# Patient Record
Sex: Male | Born: 1970 | Race: Black or African American | Hispanic: No | Marital: Single | Smoking: Never smoker
Health system: Southern US, Community
[De-identification: ages and names within clinical notes are randomized; demographics above are authoritative.]

## PROBLEM LIST (undated history)

## (undated) HISTORY — PX: FRACTURE SURGERY: SHX138

---

## 2016-07-26 ENCOUNTER — Emergency Department (HOSPITAL_COMMUNITY)
Admission: EM | Admit: 2016-07-26 | Discharge: 2016-07-26 | Disposition: A | Discharge status: Home / Self Care | Payer: Self-pay | Attending: Emergency Medicine | Admitting: Emergency Medicine

## 2016-07-26 ENCOUNTER — Encounter (HOSPITAL_COMMUNITY): Payer: Self-pay | Admitting: Emergency Medicine

## 2016-07-26 ENCOUNTER — Emergency Department (HOSPITAL_COMMUNITY): Payer: Self-pay

## 2016-07-26 DIAGNOSIS — R0789 Other chest pain: Secondary | ICD-10-CM | POA: Insufficient documentation

## 2016-07-26 DIAGNOSIS — J209 Acute bronchitis, unspecified: Secondary | ICD-10-CM | POA: Insufficient documentation

## 2016-07-26 DIAGNOSIS — H9222 Otorrhagia, left ear: Secondary | ICD-10-CM | POA: Insufficient documentation

## 2016-07-26 LAB — I-STAT TROPONIN, ED: Troponin i, poc: 0 ng/mL (ref 0.00–0.08)

## 2016-07-26 LAB — CBC
HEMATOCRIT: 46.4 % (ref 39.0–52.0)
Hemoglobin: 16 g/dL (ref 13.0–17.0)
MCH: 28.7 pg (ref 26.0–34.0)
MCHC: 34.5 g/dL (ref 30.0–36.0)
MCV: 83.3 fL (ref 78.0–100.0)
Platelets: 260 10*3/uL (ref 150–400)
RBC: 5.57 MIL/uL (ref 4.22–5.81)
RDW: 14 % (ref 11.5–15.5)
WBC: 7.3 10*3/uL (ref 4.0–10.5)

## 2016-07-26 LAB — BASIC METABOLIC PANEL
Anion gap: 8 (ref 5–15)
BUN: 10 mg/dL (ref 6–20)
CO2: 27 mmol/L (ref 22–32)
Calcium: 9.1 mg/dL (ref 8.9–10.3)
Chloride: 101 mmol/L (ref 101–111)
Creatinine, Ser: 1.24 mg/dL (ref 0.61–1.24)
GFR calc Af Amer: >60 mL/min (ref >60)
Glucose, Bld: 106 mg/dL — ABNORMAL HIGH (ref 65–99)
POTASSIUM: 3.8 mmol/L (ref 3.5–5.1)
Sodium: 136 mmol/L (ref 135–145)

## 2016-07-26 NOTE — ED Provider Notes (Signed)
[facility]-EMERGENCY DEPT Provider Note   CSN: [id number] Arrival date & time: 07/26/16  0930     History   Chief Complaint Chief Complaint  Patient presents with  . Chest Pain  . Abdominal Cramping  . Back Pain    HPI Paul Delgado is a 46 y.o. male.  HPI Patient reports that he had some bleeding from his left ear. He reports he had been scratching it pretty aggressively because a couple days ago was popping a lot. He has not seen any blood today. The popping sounds have decreased. He also reports that he's gotten spontaneous cramps on both sides of his chest wall. These frequently are triggered by laughing but also can occur while he is driving and at rest. He denies any problems with chest pain or dyspnea with exertion. He reports he goes to the gym sometimes works out and he is not having any limitations to his physical capacity. He reports he has had some cough recently with some yellow and brown mucus production. No fever and no shortness of breath. No abdominal pain nausea or vomiting. No lower extremity swelling or calf pain. History reviewed. No pertinent past medical history.  There are no active problems to display for this patient.   Past Surgical History:  Procedure Laterality Date  . FRACTURE SURGERY         Home Medications    Prior to Admission medications   Not on File    Family History No family history on file.  Social History Social History  Substance Use Topics  . Smoking status: Never Smoker  . Smokeless tobacco: Never Used  . Alcohol use Yes     Comment: occasional      Allergies   Patient has no known allergies.   Review of Systems Review of Systems  10 Systems reviewed and are negative for acute change except as noted in the HPI.  Physical Exam Updated Vital Signs BP (!) 146/95 (BP Location: Left Arm)   Pulse 84   Temp 98.1 F (36.7 C) (Oral)   Resp 14   Ht  (1.854 m)   Wt (!) 320 lb (145.2 kg)   SpO2 100%   BMI 42.22  kg/m   Physical Exam  Constitutional: He is oriented to person, place, and time. He appears well-developed and well-nourished.  Patient is alert and nontoxic. No respiratory distress. Positive obesity but does also appear to have strong musculature development.  HENT:  Head: Normocephalic and atraumatic.  Nose: Nose normal.  Mouth/Throat: Oropharynx is clear and moist.  Eyes: Conjunctivae and EOM are normal.  Neck: Neck supple. No thyromegaly present.  Cardiovascular: Normal rate, regular rhythm, normal heart sounds and intact distal pulses.   No murmur heard. Pulmonary/Chest: Effort normal and breath sounds normal. No respiratory distress. He exhibits no tenderness.  Chest walls normal.  Abdominal: Soft. He exhibits no distension. There is no tenderness. There is no guarding.  Musculoskeletal: Normal range of motion. He exhibits no edema or tenderness.  Lymphadenopathy:    He has no cervical adenopathy.  Neurological: He is alert and oriented to person, place, and time. No cranial nerve deficit. He exhibits normal muscle tone. Coordination normal.  Skin: Skin is warm and dry.  Psychiatric: He has a normal mood and affect.  Nursing note and vitals reviewed.    ED Treatments / Results  Labs (all labs ordered are listed, but only abnormal results are displayed) Labs Reviewed  BASIC METABOLIC PANEL - Abnormal; Notable for  the following:       Result Value   Glucose, Bld 106 (*)    All other components within normal limits  CBC  I-STAT TROPOININ, ED    EKG  EKG Interpretation  Date/Time:  Tuesday July 26 2016 09:37:46 EDT Ventricular Rate:  85 PR Interval:    QRS Duration: 91 QT Interval:  352 QTC Calculation: 419 R Axis:   46 Text Interpretation:  Sinus rhythm No old tracing to compare Confirmed by [place]  MD, ELLIOTT [phone]) on 07/26/2016 10:35:19 AM       Radiology Dg Chest 2 View  Result Date: 07/26/2016 CLINICAL DATA:  Bilateral chest and abdominal pain for 2  days. EXAM: CHEST  2 VIEW COMPARISON:  None. FINDINGS: Heart and mediastinal contours are within normal limits. No focal opacities or effusions. No acute bony abnormality. IMPRESSION: No active cardiopulmonary disease. Electronically Signed   By: Charlett Nose M.D.   On: 07/26/2016 09:47    Procedures Procedures (including critical care time)  Medications Ordered in ED Medications - No data to display   Initial Impression / Assessment and Plan / ED Course  I have reviewed the triage vital signs and the nursing notes.  Pertinent labs & imaging results that were available during my care of the patient were reviewed by me and considered in my medical decision making (see chart for details).       Final Clinical Impressions(s) / ED Diagnoses   Final diagnoses:  Chest wall pain  Acute bronchitis, unspecified organism  Physical examination is normal. Diagnostic workup was negative. Patient is getting sporadic cramping feelings on his chest wall bilaterally particularly triggered by laughing and driving. This may be musculoskeletal spasm and pain from physical workouts. Patient is counseled for signs and symptoms to trigger recheck but he will take approximately 2 weeks to see if symptoms resolve spontaneously. Patient also describes some bronchitis with productive cough. Chest x-ray is clear, white blood cell count is normal and physical examination is normal. At this time I do not feel he needs additional treatment for this. Patient is given resource guide to establish follow-up.  New Prescriptions New Prescriptions   No medications on file     Arby Barrette, MD 07/26/16 1141

## 2016-07-26 NOTE — ED Triage Notes (Signed)
Patient c/o bilat lateral abd cramps x 2 days. Today patient started having upper central and left sided chest pain that radiates to lower back.

## 2017-07-25 IMAGING — CR DG CHEST 2V
2 series · 2 of 2 positions shown · non-contrast
Comparison: None.

CLINICAL DATA: Bilateral chest and abdominal pain for 2 days.

EXAM:
CHEST  2 VIEW

[w chest pa]
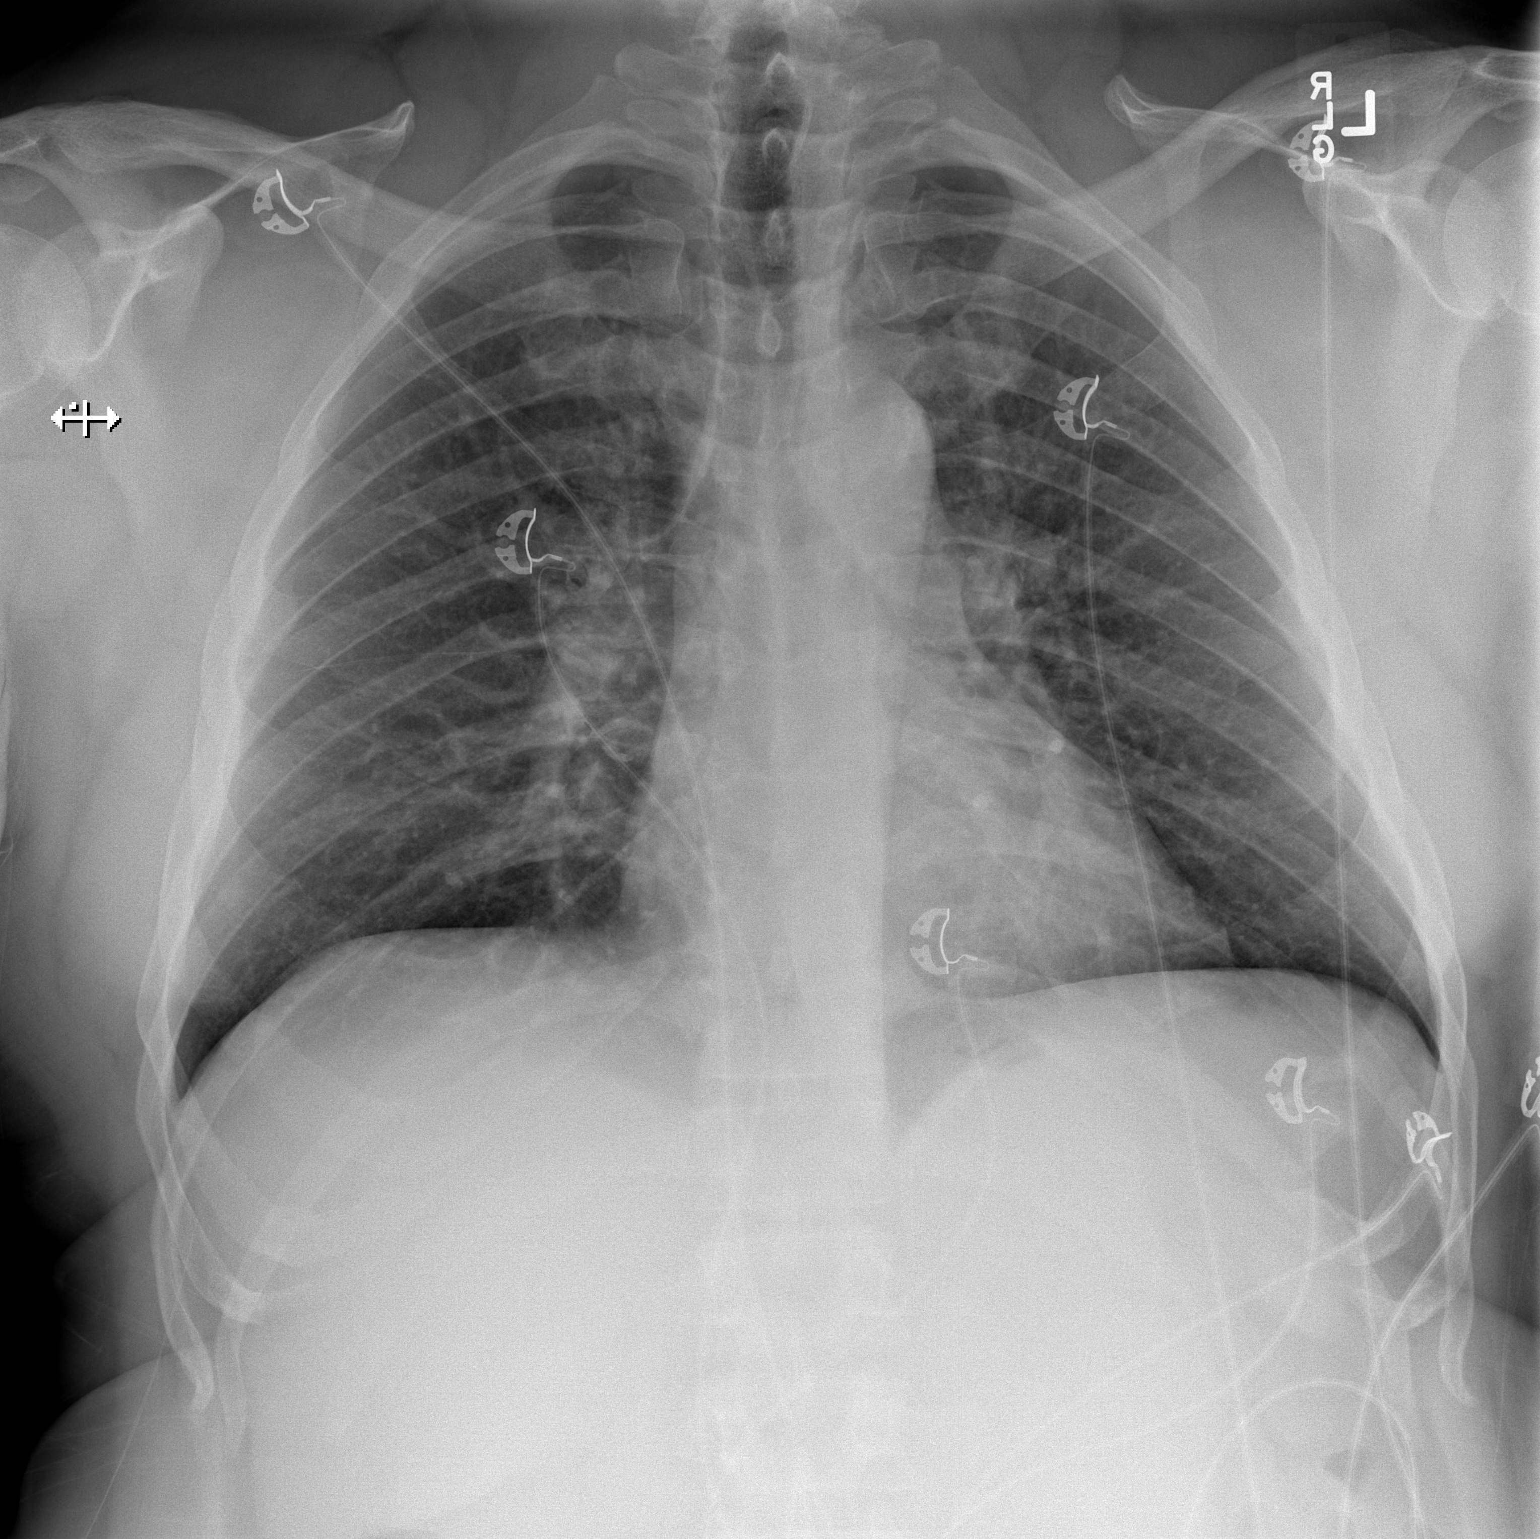

[w chest lat]
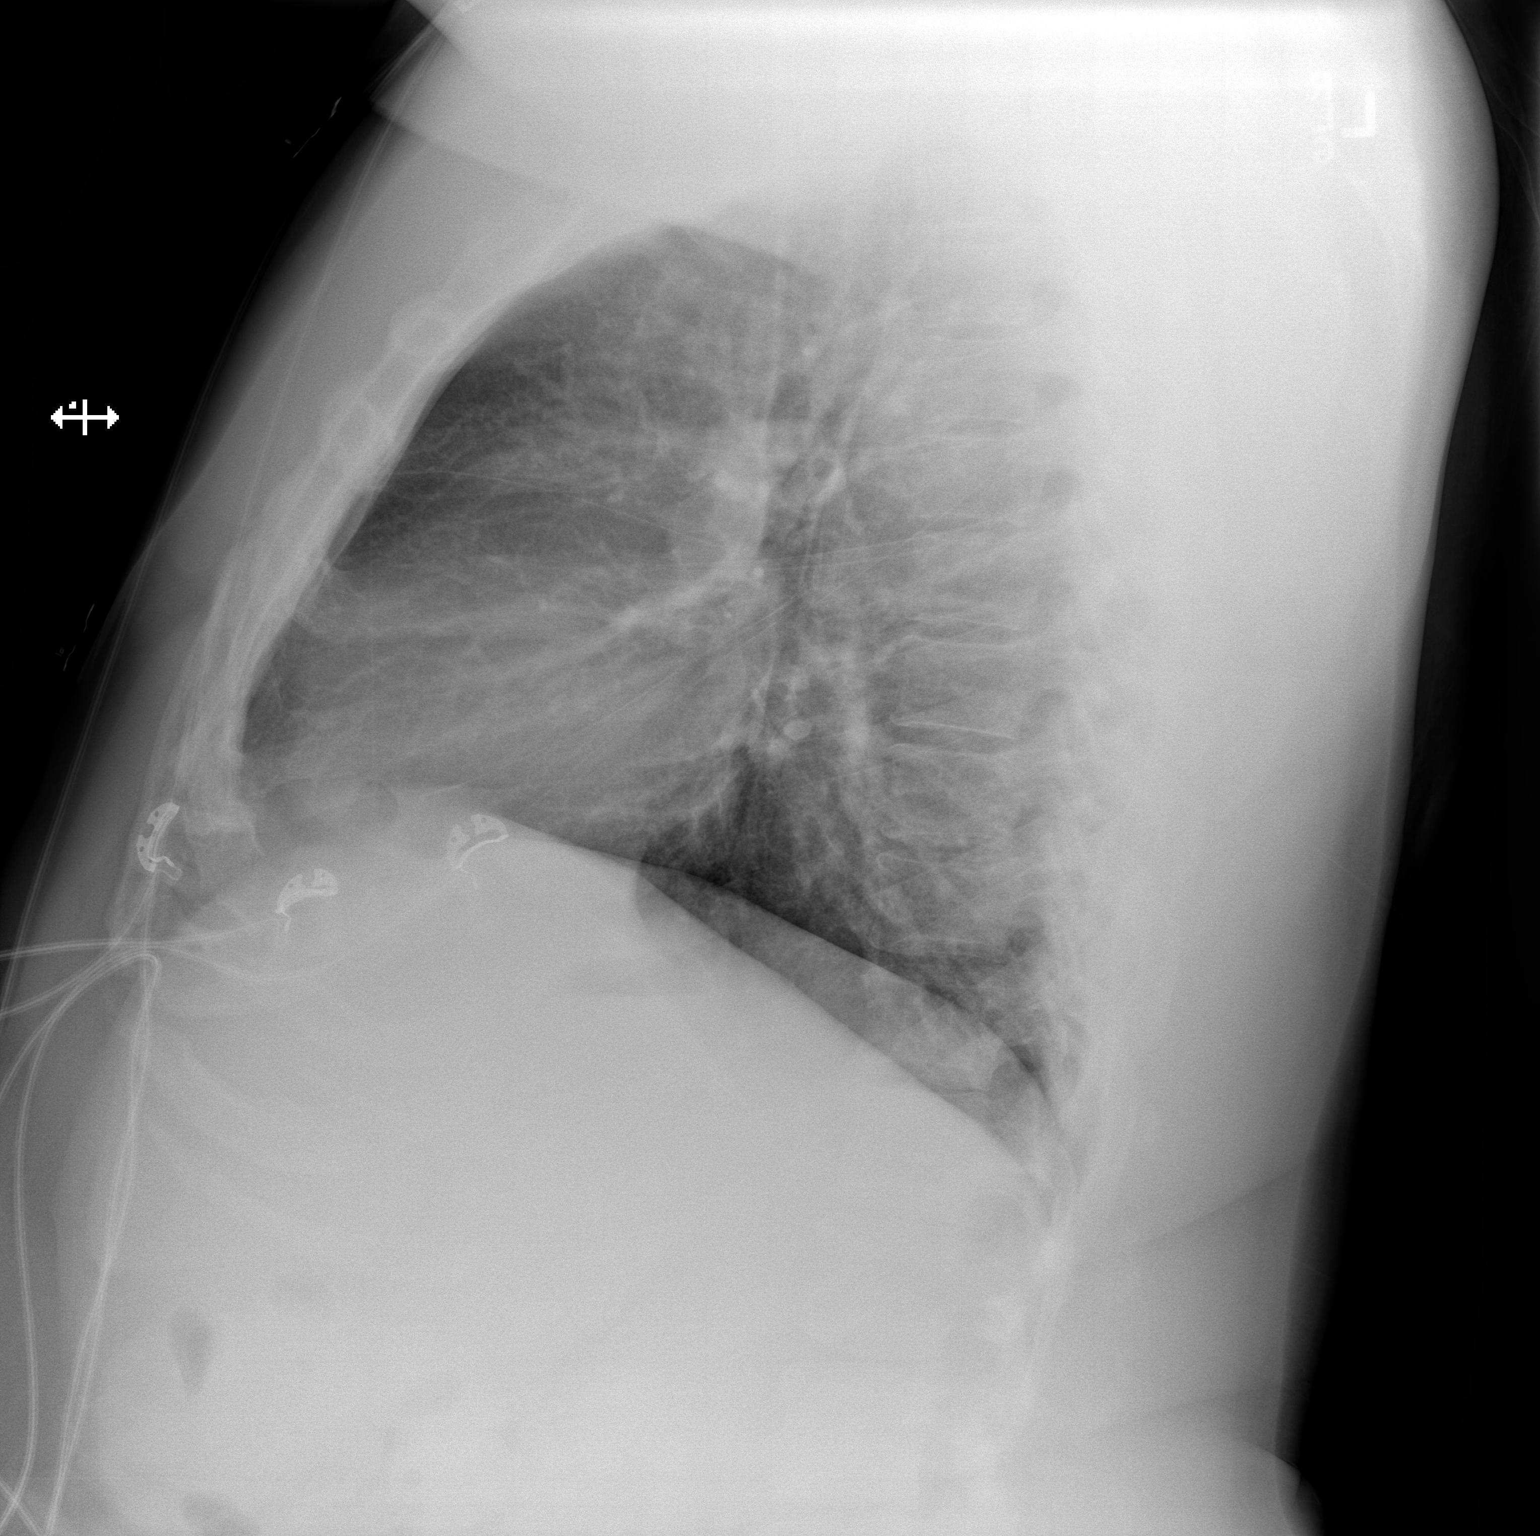

[2 of 2 positions shown; findings below may reference images not displayed]

FINDINGS: Heart and mediastinal contours are within normal limits. No focal
opacities or effusions. No acute bony abnormality.
IMPRESSION: No active cardiopulmonary disease.

## 2020-05-24 ENCOUNTER — Ambulatory Visit
Admission: AD | Admit: 2020-05-24 | Discharge: 2020-05-24 | Disposition: A | Discharge status: Home / Self Care | Payer: BLUE CROSS/BLUE SHIELD | Source: Ambulatory Visit | Attending: Physician Assistant | Admitting: Physician Assistant

## 2020-05-24 DIAGNOSIS — K219 Gastro-esophageal reflux disease without esophagitis: Secondary | ICD-10-CM

## 2020-05-24 DIAGNOSIS — I517 Cardiomegaly: Secondary | ICD-10-CM

## 2020-05-24 DIAGNOSIS — R9431 Abnormal electrocardiogram [ECG] [EKG]: Secondary | ICD-10-CM

## 2020-05-24 DIAGNOSIS — Z789 Other specified health status: Secondary | ICD-10-CM

## 2020-05-24 MED ORDER — OMEPRAZOLE 20 MG PO CPDR *I*
20.0000 mg | DELAYED_RELEASE_CAPSULE | Freq: Every day | ORAL | 0 refills | Status: AC
Start: 2020-05-24 — End: 2020-06-23

## 2020-05-24 MED ORDER — ALUM & MAG HYDROXIDE-SIMETH 200-200-20 MG/5ML PO SUSP *I*
30.0000 mL | Freq: Once | ORAL | Status: AC
Start: 2020-05-24 — End: 2020-05-24
  Administered 2020-05-24: 30 mL via ORAL

## 2020-05-24 NOTE — ED Triage Notes (Addendum)
"  Feels like a band is pulling around my ribcage after I eat." States he has heartburn at times. Pain 2/10. Denies constipation. Denies chest pain. No urinary problems. States he feels bloated after eating. States he has not seen a doctor in 30+ years.

## 2020-05-24 NOTE — Discharge Instructions (Signed)
Avoid trigger "C"s- Carbination, chocolate, citrus, coffee    Also avoid spicy foods, tomatoes, high fatty foods, and peppermint.     Learn what your trigger food is and try to avoid it    Do not eat 3 hours before bedtime    Raise head of the bed if symptoms are present    Can use antacids (Tums) 1 hour prior to eating, after, or at bedtime    H2 Blockers (Ranitidine, Famotidine) can be used before eating or in the morning

## 2020-05-24 NOTE — UC Provider Note (Signed)
History     Chief Complaint   Patient presents with    Bloated     50 year old male patient presents with "bloating" x 5-6 months.  Patient states he started feeling "like a band around my rib cage after eating".  Patient states it feels very tight and worsens after eating greasy foods.  He admits he does feel the sensation sometimes without eating.  Patient states he thinks it may be heartburn.  He denies chest pain, SOB, abdominal pain, nausea, vomiting, or diarrhea.  States he has been having normal bowel movements.  No urinary symptoms.  Patient denies any radiation of this pain.  He admits he has not seen a doctor in over 30 years.  He denies any OTC medications.      History provided by:  Patient  Language interpreter used: No        Medical/Surgical/Family History     History reviewed. No pertinent past medical history.     There is no problem list on file for this patient.           History reviewed. No pertinent surgical history.  No family history on file.       Social History     Tobacco Use    Smoking status: Not on file    Smokeless tobacco: Not on file   Substance Use Topics    Alcohol use: Not on file    Drug use: Not on file     Living Situation     Questions Responses    Patient lives with     Homeless     Caregiver for other family member     External Services     Employment     Domestic Violence Risk                 Review of Systems   Review of Systems   Constitutional: Negative for chills and fever.   Respiratory: Negative.    Cardiovascular: Negative.    Gastrointestinal: Positive for abdominal pain. Negative for diarrhea, nausea and vomiting.   Neurological: Negative.        Physical Exam   Triage Vitals  Triage Start: Start, (05/24/20 1715)   First Recorded BP: 147/68, Resp: 18, Temp: 36 C (96.8 F), Temp src: TEMPORAL Oxygen Therapy SpO2: 98 %, Oximetry Source: Rt Hand, O2 Device: None (Room air), Heart Rate: 84, (05/24/20 1721)  .  First Pain Reported  0-10 Scale: 2, Pain  Location/Orientation: Abdomen, (05/24/20 1719)       Physical Exam  Vitals and nursing note reviewed.   Constitutional:       General: He is not in acute distress.     Appearance: Normal appearance. He is obese. He is not ill-appearing, toxic-appearing or diaphoretic.   HENT:      Head: Normocephalic and atraumatic.   Eyes:      Extraocular Movements: Extraocular movements intact.      Conjunctiva/sclera: Conjunctivae normal.   Cardiovascular:      Rate and Rhythm: Normal rate and regular rhythm.      Pulses: Normal pulses.      Heart sounds: Normal heart sounds.   Pulmonary:      Effort: Pulmonary effort is normal.      Breath sounds: Normal breath sounds.   Abdominal:      General: Bowel sounds are normal. There is no distension.      Palpations: Abdomen is soft.      Tenderness: There  is no abdominal tenderness.       Musculoskeletal:         General: Normal range of motion.      Cervical back: Normal range of motion.   Skin:     General: Skin is warm.   Neurological:      General: No focal deficit present.      Mental Status: He is alert and oriented to person, place, and time.   Psychiatric:         Mood and Affect: Mood normal.         Behavior: Behavior normal. Behavior is cooperative.          Medical Decision Making        Medical Decision Making  Assessment:    50 year old male patient presents with upper abdominal pain x several months. Pain seems to flare up after eating greasy foods. Denies chest pain or shortness of breath. VSS.    Patient states pain has decreased after Maalox 30 ml PO.    Differential diagnosis:    GERD  Gastritis   Cholecystitis  PUD  Pancreatitis     Plan and Results:    Discussed symptomatic treatment options and return precautions. Patient will follow up with PCP in 1 week. Patient verbalizes understanding and is in agreement with plan.   Encounter orders    Orders Placed This Encounter      ED/UC REFERRAL TO PRIMARY CARE      EKG 12 lead (initial)      aluminum & magnesium  hydroxide w/simethicone (MAALOX ADVANCED REGULAR) suspension 30 mL      omeprazole (PRILOSEC) 20 mg capsule    Lab results   No results found for this or any previous visit (from the past 24 hour(s)).    EKG Interpretation:      Normal sinus rhythm      Independent Review of: existing labs/imaging, XRays this encounter and chart/prior records    Diagnosis and Disposition:       Return precautions discussed and provided on AVS.    Discharge Medications  Current Discharge Medication List    New Medications    omeprazole (PriLOSEC) 20 mg  Dose: 20 mg  Take 20 mg by mouth daily (before breakfast)   Quantity 30 capsule Refill 0  Start date: 05/24/2020, End date: 06/23/2020  Comments: Emergency Encounter        Follow-up  @AVSFOLLOWUP @  Patient Instructions   .       Avoid trigger "C"s- Carbination, chocolate, citrus, coffee    Also avoid spicy foods, tomatoes, high fatty foods, and peppermint.     Learn what your trigger food is and try to avoid it    Do not eat 3 hours before bedtime    Raise head of the bed if symptoms are present    Can use antacids (Tums) 1 hour prior to eating, after, or at bedtime    H2 Blockers (Ranitidine, Famotidine) can be used before eating or in the   morning          Final Diagnosis  Final diagnoses:   [K21.9] Gastroesophageal reflux disease, unspecified whether esophagitis present (Primary)         , PA              [place], [place], [place]  05/25/20 [phone]

## 2020-05-24 NOTE — ED Notes (Signed)
Discharge paperwork gone over with patient, all questions answered and pt verbalized understanding.

## 2020-06-01 LAB — EKG 12-LEAD
P: 37 deg
PR: 190 ms
QRS: 19 deg
QRSD: 89 ms
QT: 349 ms
QTc: 410 ms
Rate: 83 {beats}/min
T: 23 deg

## 2020-07-13 NOTE — Progress Notes (Deleted)
[facility] - Establish Care Visit    Active Problems    No chief complaint on file.    Problems reviewed at this visit were:   No diagnosis found.  ***  Medication List  Reviewed.    Allergies  Reviewed.    OBJECTIVE  Vitals  There were no vitals filed for this visit.  BP Readings from Last 3 Encounters:   05/24/20 147/68     Physical Exam  Vitals: reviewed.  ***    ASSESSMENT & PLAN  50 y.o. male here to establish care.    No diagnosis found.  ***    No follow-ups on file.    Santina Evans, MD MPH    To my patients: My notes are dictated using voice recognition software. While I try to proofread my notes, some errors do escape my attention. If there is a major error that needs to be corrected urgently, please contact me via MyChart. If there is a minor error, please discuss this with me at your next appointment.

## 2020-07-14 ENCOUNTER — Ambulatory Visit: Payer: BLUE CROSS/BLUE SHIELD | Admitting: Internal Medicine

## 2020-08-09 ENCOUNTER — Ambulatory Visit
Admission: AD | Admit: 2020-08-09 | Discharge: 2020-08-09 | Disposition: A | Discharge status: Home / Self Care | Payer: BLUE CROSS/BLUE SHIELD | Source: Ambulatory Visit

## 2020-08-10 ENCOUNTER — Emergency Department
Admission: EM | Admit: 2020-08-10 | Discharge: 2020-08-10 | Discharge status: Against Medical Advice | Payer: BLUE CROSS/BLUE SHIELD | Source: Ambulatory Visit | Attending: Emergency Medicine | Admitting: Emergency Medicine

## 2020-08-10 DIAGNOSIS — Z5321 Procedure and treatment not carried out due to patient leaving prior to being seen by health care provider: Secondary | ICD-10-CM | POA: Insufficient documentation

## 2020-08-10 DIAGNOSIS — R1010 Upper abdominal pain, unspecified: Secondary | ICD-10-CM

## 2020-08-10 NOTE — ED Notes (Signed)
Pt called x 3 with no answer

## 2020-08-10 NOTE — ED Triage Notes (Signed)
C/o upper quadrant abd pain when eating. States feels restrictive. Notes s/s x 1 year.

## 2020-08-10 NOTE — First Provider Contact (Signed)
ED First Provider Contact Note    Initial provider evaluation performed by me on 08/10/20 at 6:17 pm    Vital signs reviewed.    Assessment: 50 year old male with upper abdominal pain when eating.  Feels like a band across his upper abdomen.  Pain usually goes away after having a bowel movement.  This has been going on for about a year.  No vomiting.  Has not been able to find a PCP.    Orders placed:  LABS     Patient requires further evaluation.     Brendan Ace, MD, 08/10/2020, 6:17 PM     Brendan Ace, MD  08/10/20 [phone]

## 2020-08-14 ENCOUNTER — Encounter: Payer: Self-pay | Admitting: Emergency Medicine

## 2020-08-14 ENCOUNTER — Emergency Department
Admission: EM | Admit: 2020-08-14 | Discharge: 2020-08-15 | Disposition: A | Discharge status: Home / Self Care | Payer: BLUE CROSS/BLUE SHIELD | Source: Ambulatory Visit | Attending: Emergency Medicine | Admitting: Emergency Medicine

## 2020-08-14 DIAGNOSIS — R9431 Abnormal electrocardiogram [ECG] [EKG]: Secondary | ICD-10-CM

## 2020-08-14 DIAGNOSIS — R1013 Epigastric pain: Secondary | ICD-10-CM | POA: Insufficient documentation

## 2020-08-14 DIAGNOSIS — R140 Abdominal distension (gaseous): Secondary | ICD-10-CM | POA: Insufficient documentation

## 2020-08-14 DIAGNOSIS — Z789 Other specified health status: Secondary | ICD-10-CM

## 2020-08-14 LAB — CBC AND DIFFERENTIAL
Baso # K/uL: 0.1 10*3/uL (ref 0.0–0.1)
Basophil %: 0.9 %
Eos # K/uL: 0.4 10*3/uL (ref 0.0–0.5)
Eosinophil %: 3.8 %
Hematocrit: 51 % (ref 40–51)
Hemoglobin: 16.6 g/dL (ref 13.7–17.5)
IMM Granulocytes #: 0.1 10*3/uL — ABNORMAL HIGH (ref 0.0–0.0)
IMM Granulocytes: 1.3 %
Lymph # K/uL: 3.2 10*3/uL (ref 1.3–3.6)
Lymphocyte %: 34.6 %
MCH: 27 pg (ref 26–32)
MCHC: 32 g/dL (ref 32–37)
MCV: 85 fL (ref 79–92)
Mono # K/uL: 0.7 10*3/uL (ref 0.3–0.8)
Monocyte %: 7.3 %
Neut # K/uL: 4.7 10*3/uL (ref 1.8–5.4)
Nucl RBC # K/uL: 0 10*3/uL (ref 0.0–0.0)
Nucl RBC %: 0 /100 WBC (ref 0.0–0.2)
Platelets: 300 10*3/uL (ref 150–330)
RBC: 6.1 MIL/uL (ref 4.6–6.1)
RDW: 14.5 % — ABNORMAL HIGH (ref 11.6–14.4)
Seg Neut %: 52.1 %
WBC: 9.1 10*3/uL (ref 4.2–9.1)

## 2020-08-14 LAB — EKG 12-LEAD
P: 39 deg
PR: 189 ms
QRS: 29 deg
QRSD: 95 ms
QT: 375 ms
QTc: 419 ms
Rate: 75 {beats}/min
T: 32 deg

## 2020-08-14 LAB — PLASMA PROF 7 (ED ONLY)
Anion Gap,PL: 15 (ref 7–16)
CO2,Plasma: 24 mmol/L (ref 20–28)
Chloride,Plasma: 98 mmol/L (ref 96–108)
Creatinine: 1.16 mg/dL (ref 0.67–1.17)
Glucose,Plasma: 105 mg/dL — ABNORMAL HIGH (ref 60–99)
Potassium,Plasma: 4.1 mmol/L (ref 3.3–4.6)
Sodium,Plasma: 137 mmol/L (ref 133–145)
UN,Plasma: 11 mg/dL (ref 6–20)
eGFR BY CREAT: 77 *

## 2020-08-14 LAB — RUQ PANEL (ED ONLY)
ALT: 31 U/L (ref 0–50)
AST: 41 U/L (ref 0–50)
Albumin: 4.6 g/dL (ref 3.5–5.2)
Alk Phos: 54 U/L (ref 40–130)
Amylase: 121 U/L — ABNORMAL HIGH (ref 28–100)
Bilirubin,Direct: <0.2 mg/dL (ref 0.0–0.3)
Bilirubin,Total: 0.4 mg/dL (ref 0.0–1.2)
Lipase: 30 U/L (ref 13–60)
Total Protein: 7.5 g/dL (ref 6.3–7.7)

## 2020-08-14 MED ORDER — FAMOTIDINE 20 MG PO TABS *I*
20.0000 mg | ORAL_TABLET | Freq: Two times a day (BID) | ORAL | 0 refills | Status: AC
Start: 2020-08-14 — End: 2020-09-13

## 2020-08-14 NOTE — ED Notes (Signed)
WAITING ROOM CHECK    Patient visualized in waiting room. Patient appears in no acute distress. Patient's chart reviewed and most recent vital signs reviewed. Will continue to monitor.

## 2020-08-14 NOTE — Discharge Instructions (Addendum)
You were seen in the ER today for your upper abdominal pain with eating and bloating. Your symptoms are likely due to irritation in the stomach or gastritis. The treatment for this is to help reduce the stomach acid and eat foods that do not irritate it (avoid alcohol, fatty foods and do not smoke). You were prescribed pepcid to help as well. You can use over the counter medicine such as tums too.     You had blood work that didn't show any significant abnormalities and an ekg was done that didn't show any concerns about your heart.    Follow up with your primary care doctor, call Monday to schedule an appointment (you were given a referral)

## 2020-08-14 NOTE — ED Provider Notes (Signed)
History     Chief Complaint   Patient presents with    Bloated     HPI     Brendan Orozco is a 50 y.o. male that denies PMH presents c/o epigastric discomfort, bloating and feeling of fullness around his upper abdomen. Symptoms occur after eating and have been ongoing > 1 year. He saw UC one and was prescribed omeprazole, took it for a few days but it didn't help. Denies nausea or vomiting. No constipation, diarrhea, melena. No fever or chills. No chest pain or dyspnea. Today symptoms occurred after he ate waffles for breakfast, he tried taking Tums and alkaseltzer which did resolve his symptoms. Currently symptom free.     Medical/Surgical/Family History     History reviewed. No pertinent past medical history.     There is no problem list on file for this patient.           History reviewed. No pertinent surgical history.  No family history on file.       Social History     Tobacco Use    Smoking status: Not on file    Smokeless tobacco: Not on file   Substance Use Topics    Alcohol use: Not on file    Drug use: Not on file     Living Situation     Questions Responses    Patient lives with     Homeless     Caregiver for other family member     External Services     Employment     Domestic Violence Risk                 Review of Systems   Review of Systems   Constitutional: Negative for fever.   HENT: Negative for congestion.    Eyes: Negative for visual disturbance.   Respiratory: Negative for shortness of breath.    Cardiovascular: Negative for chest pain.   Gastrointestinal: Positive for abdominal distention and abdominal pain. Negative for blood in stool, constipation, diarrhea, nausea and vomiting.   Genitourinary: Negative for difficulty urinating.   Musculoskeletal: Negative for back pain.   Skin: Negative for rash.   Allergic/Immunologic: Negative for immunocompromised state.   Neurological: Negative for headaches.   Hematological: Does not bruise/bleed easily.   Psychiatric/Behavioral: Negative for  confusion.       Physical Exam     Triage Vitals  Triage Start: Start, (08/14/20 1632)   First Recorded BP: 130/72, Resp: 16, Temp: 37.1 C (98.8 F), Temp src: Tympanic Oxygen Therapy SpO2: 98 %, Oximetry Source: Rt Hand, Heart Rate: 83, (08/14/20 1630)  .  First Pain Reported  0-10 Scale: 0, (08/14/20 1630)       Physical Exam  Vitals and nursing note reviewed.   Constitutional:       General: He is not in acute distress.     Appearance: He is not diaphoretic.      Comments: Well appearing, not in acute distress. Afebrile. Vitals are within normal limits.     HENT:      Head: Normocephalic and atraumatic.      Mouth/Throat:      Pharynx: No oropharyngeal exudate.   Eyes:      Conjunctiva/sclera: Conjunctivae normal.   Cardiovascular:      Rate and Rhythm: Normal rate and regular rhythm.      Pulses: Normal pulses.      Heart sounds: Normal heart sounds.   Pulmonary:      Effort:  Pulmonary effort is normal. No respiratory distress.      Breath sounds: Normal breath sounds.   Chest:      Chest wall: No tenderness.   Abdominal:      General: Bowel sounds are normal. There is no distension.      Palpations: Abdomen is soft.      Tenderness: There is no abdominal tenderness.   Musculoskeletal:         General: No tenderness. Normal range of motion.      Cervical back: Normal range of motion and neck supple.      Right lower leg: No edema.      Left lower leg: No edema.   Lymphadenopathy:      Cervical: No cervical adenopathy.   Skin:     General: Skin is warm and dry.      Capillary Refill: Capillary refill takes less than 2 seconds.      Findings: No rash.   Neurological:      General: No focal deficit present.      Mental Status: He is alert and oriented to person, place, and time.         Medical Decision Making   Patient seen by me on:  08/14/2020    Assessment:  Brendan Orozco is a 50 y.o. male that denies PMH presents c/o epigastric discomfort, bloating and feeling of fullness around his upper abdomen. Symptoms occur  after eating and have been ongoing > 1 year. He saw UC one and was prescribed omeprazole, took it for a few days but it didn't help. Denies nausea or vomiting. No constipation, diarrhea, melena. No fever or chills. No chest pain or dyspnea. Today symptoms occurred after he ate waffles for breakfast, he tried taking Tums and alkaseltzer which did resolve his symptoms. Currently symptom free.     On exam, abdomen soft w/o tenderness. Currently symptom free.    Differential diagnosis:  GERD/gastritis/PUD/esophagitis, pancreatitis, liver dysfunction  Low suspicion for atypical ACS, will check EKG  Low suspicion for cholecystitis given no tenderness    Plan:    Medications - No data to display    Orders Placed This Encounter      CBC and differential      Plasma profile 7 (Adult ED only)      RUQ panel (ED only)      ED/UC REFERRAL TO PRIMARY CARE      EKG 12 lead (initial)      EKG Interpretation: normal sinus rhythm, no ischemic changes    ED Course:   Labs and EKG reassuring. Discussed most likely GI symptoms and trial of diet changes, pepcid and PCP referral. Discussed return precautions, he expressed understanding and agreement.            Marita Snellen, MD          Marita Snellen, MD  08/17/20 1410

## 2020-08-14 NOTE — ED Triage Notes (Addendum)
Endorsing increased bloating and upper abdominal discomfort today after eating. Reports relief from TUMS. No changes in BM.            Prehospital medications given: No

## 2020-08-18 NOTE — Progress Notes (Signed)
Received an inquiry that this patient was interested in becoming a new patient at [facility].  Per the patient, they no longer need a new patient visit at [facility]. Patient states in interested in an Internal Medicine practice.

## 8386-12-18 DEATH — deceased
